# Patient Record
Sex: Female | Born: 1990 | Race: White | Hispanic: No | Marital: Single | Smoking: Current some day smoker
Health system: Southern US, Community
[De-identification: ages and names within clinical notes are randomized; demographics above are authoritative.]

---
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
## 2013-12-03 ENCOUNTER — Inpatient Hospital Stay (HOSPITAL_COMMUNITY): Payer: Federal, State, Local not specified - PPO

## 2013-12-03 ENCOUNTER — Inpatient Hospital Stay (HOSPITAL_COMMUNITY)
Admission: AD | Admit: 2013-12-03 | Discharge: 2013-12-03 | Disposition: A | Discharge status: Home / Self Care | Payer: Federal, State, Local not specified - PPO | Source: Ambulatory Visit | Attending: Obstetrics and Gynecology | Admitting: Obstetrics and Gynecology

## 2013-12-03 ENCOUNTER — Encounter (HOSPITAL_COMMUNITY): Payer: Self-pay | Admitting: *Deleted

## 2013-12-03 DIAGNOSIS — O200 Threatened abortion: Secondary | ICD-10-CM

## 2013-12-03 DIAGNOSIS — N898 Other specified noninflammatory disorders of vagina: Secondary | ICD-10-CM | POA: Diagnosis present

## 2013-12-03 LAB — CBC
HEMATOCRIT: 39.8 % (ref 36.0–46.0)
HEMOGLOBIN: 13.5 g/dL (ref 12.0–15.0)
MCH: 29.4 pg (ref 26.0–34.0)
MCHC: 33.9 g/dL (ref 30.0–36.0)
MCV: 86.7 fL (ref 78.0–100.0)
Platelets: 237 10*3/uL (ref 150–400)
RBC: 4.59 MIL/uL (ref 3.87–5.11)
RDW: 13.1 % (ref 11.5–15.5)
WBC: 8.8 10*3/uL (ref 4.0–10.5)

## 2013-12-03 LAB — URINALYSIS, ROUTINE W REFLEX MICROSCOPIC
Bilirubin Urine: NEGATIVE
GLUCOSE, UA: NEGATIVE mg/dL
KETONES UR: NEGATIVE mg/dL
LEUKOCYTES UA: NEGATIVE
Nitrite: NEGATIVE
PH: 7 (ref 5.0–8.0)
Protein, ur: NEGATIVE mg/dL
Specific Gravity, Urine: 1.01 (ref 1.005–1.030)
Urobilinogen, UA: 0.2 mg/dL (ref 0.0–1.0)

## 2013-12-03 LAB — URINE MICROSCOPIC-ADD ON

## 2013-12-03 LAB — HCG, QUANTITATIVE, PREGNANCY: HCG, BETA CHAIN, QUANT, S: 9 m[IU]/mL — AB (ref <5)

## 2013-12-03 LAB — ABO/RH: ABO/RH(D): A NEG

## 2013-12-03 MED ORDER — RHO D IMMUNE GLOBULIN 1500 UNIT/2ML IJ SOSY
300.0000 ug | PREFILLED_SYRINGE | Freq: Once | INTRAMUSCULAR | Status: AC
  Administered 2013-12-03: 300 ug via INTRAMUSCULAR
  Filled 2013-12-03: qty 2

## 2013-12-03 NOTE — MAU Provider Note (Signed)
Attestation of Attending Supervision of Advanced Practitioner (CNM/NP): Evaluation and management procedures were performed by the Advanced Practitioner under my supervision and collaboration.  I have reviewed the Advanced Practitioner's note and chart, and I agree with the management and plan.  Saida Lonon 12/03/2013 5:44 PM   

## 2013-12-03 NOTE — MAU Provider Note (Signed)
History     CSN: [id number]  Arrival date and time: 12/03/13 1322   None     Chief Complaint  Patient presents with  . Possible Pregnancy  . Vaginal Discharge  . Abdominal Cramping   Possible Pregnancy  Vaginal Discharge The patient's primary symptoms include a vaginal discharge.  Abdominal Cramping    Tara Bailey is a 23 y.o. G1P0000 at Unknown who presents today with vaginal bleeding. She states that yesterday she was having spotting and some brown bleeding. Today she has had heavy bright red bleeding and cramping. She has not established care at this time.   History reviewed. No pertinent past medical history.  History reviewed. No pertinent past surgical history.  History reviewed. No pertinent family history.  History  Substance Use Topics  . Smoking status: Never Smoker   . Smokeless tobacco: Never Used  . Alcohol Use: No    Allergies:  Allergies  Allergen Reactions  . Iodine Nausea And Vomiting  . Latex Itching and Rash    Prescriptions prior to admission  Medication Sig Dispense Refill  . Prenatal Vit-Fe Fumarate-FA (PRENATAL MULTIVITAMIN) TABS tablet Take 1 tablet by mouth daily at 12 noon.        Review of Systems  Genitourinary: Positive for vaginal discharge.   Physical Exam   Blood pressure 129/75, pulse 83, temperature 98.8 F (37.1 C), resp. rate 16, height  (1.727 m), weight 72.666 kg (160 lb 3.2 oz), last menstrual period 10/20/2013, SpO2 99.00%.  Physical Exam  Nursing note and vitals reviewed. Constitutional: She is oriented to person, place, and time. She appears well-developed and well-nourished. No distress.  Cardiovascular: Normal rate.   Respiratory: Effort normal.  GI: Soft. There is no tenderness. There is no rebound.  Genitourinary:   External: no lesion Vagina: Moderate amount or BRB in vagina, with ? Tissue/membrane Cervix: pink, smooth,  Uterus: NSSC Adnexa: NT   Neurological: She is alert and oriented  to person, place, and time.  Skin: Skin is warm and dry.  Psychiatric: She has a normal mood and affect.    MAU Course  Procedures Results for orders placed during the hospital encounter of 12/03/13 (from the past 24 hour(s))  URINALYSIS, ROUTINE W REFLEX MICROSCOPIC     Status: Abnormal   Collection Time    12/03/13  1:50 PM      Result Value Ref Range   Color, Urine YELLOW  YELLOW   APPearance CLEAR  CLEAR   Specific Gravity, Urine 1.010  1.005 - 1.030   pH 7.0  5.0 - 8.0   Glucose, UA NEGATIVE  NEGATIVE mg/dL   Hgb urine dipstick LARGE (*) NEGATIVE   Bilirubin Urine NEGATIVE  NEGATIVE   Ketones, ur NEGATIVE  NEGATIVE mg/dL   Protein, ur NEGATIVE  NEGATIVE mg/dL   Urobilinogen, UA 0.2  0.0 - 1.0 mg/dL   Nitrite NEGATIVE  NEGATIVE   Leukocytes, UA NEGATIVE  NEGATIVE  URINE MICROSCOPIC-ADD ON     Status: Abnormal   Collection Time    12/03/13  1:50 PM      Result Value Ref Range   Squamous Epithelial / LPF FEW (*) RARE   WBC, UA 0-2  <3 WBC/hpf   RBC / HPF 0-2  <3 RBC/hpf   Bacteria, UA RARE  RARE   Urine-Other AMORPHOUS URATES/PHOSPHATES    ABO/RH     Status: None   Collection Time    12/03/13  2:51 PM      Result Value  Ref Range   ABO/RH(D) A NEG    HCG, QUANTITATIVE, PREGNANCY     Status: Abnormal   Collection Time    12/03/13  2:51 PM      Result Value Ref Range   hCG, Beta Chain, Quant, S 9 (*) <5 mIU/mL  RH IG WORKUP (INCLUDES ABO/RH)     Status: None   Collection Time    12/03/13  2:51 PM      Result Value Ref Range   Gestational Age(Wks) 4     ABO/RH(D) A NEG     Antibody Screen PENDING    CBC     Status: None   Collection Time    12/03/13  2:52 PM      Result Value Ref Range   WBC 8.8  4.0 - 10.5 K/uL   RBC 4.59  3.87 - 5.11 MIL/uL   Hemoglobin 13.5  12.0 - 15.0 g/dL   HCT 16.1  09.6 - 04.5 %   MCV 86.7  78.0 - 100.0 fL   MCH 29.4  26.0 - 34.0 pg   MCHC 33.9  30.0 - 36.0 g/dL   RDW 40.9  81.1 - 91.4 %   Platelets 237  150 - 400 K/uL   US Ob  Comp Less 14 Wks  12/03/2013   CLINICAL DATA:  Bleeding, cramping  EXAM: OBSTETRIC <14 WK [place] AND TRANSVAGINAL OB US  TECHNIQUE: Both transabdominal and transvaginal ultrasound examinations were performed for complete evaluation of the gestation as well as the maternal uterus, adnexal regions, and pelvic cul-de-sac. Transvaginal technique was performed to assess early pregnancy.  COMPARISON:  None.  FINDINGS: Intrauterine gestational sac: Not visualized  Yolk sac:  Not visualized  Embryo:  Not visualized  Maternal uterus/adnexae: Endometrium is 5 mm in thickness. Ovaries are symmetric in size and echotexture. No adnexal masses or free fluid.  IMPRESSION: No intrauterine pregnancy visualized. Differential considerations would include early intrauterine pregnancy too early to visualize, spontaneous abortion, or occult ectopic pregnancy. Recommend close clinical followup and serial quantitative beta HCGs. Repeat ultrasound in 14 days may be helpful.   Electronically Signed   By: Charlett Nose M.D.   On: 12/03/2013 16:03   US Ob Transvaginal  12/03/2013   CLINICAL DATA:  Bleeding, cramping  EXAM: OBSTETRIC <14 WK [place] AND TRANSVAGINAL OB US  TECHNIQUE: Both transabdominal and transvaginal ultrasound examinations were performed for complete evaluation of the gestation as well as the maternal uterus, adnexal regions, and pelvic cul-de-sac. Transvaginal technique was performed to assess early pregnancy.  COMPARISON:  None.  FINDINGS: Intrauterine gestational sac: Not visualized  Yolk sac:  Not visualized  Embryo:  Not visualized  Maternal uterus/adnexae: Endometrium is 5 mm in thickness. Ovaries are symmetric in size and echotexture. No adnexal masses or free fluid.  IMPRESSION: No intrauterine pregnancy visualized. Differential considerations would include early intrauterine pregnancy too early to visualize, spontaneous abortion, or occult ectopic pregnancy. Recommend close clinical followup and serial quantitative beta  HCGs. Repeat ultrasound in 14 days may be helpful.   Electronically Signed   By: Charlett Nose M.D.   On: 12/03/2013 16:03     Assessment and Plan   1. Threatened abortion in early pregnancy    Very likely SAB given extremely low HCG Rhogam given today Bleeding precautions Return to MAU as needed  Follow-up Information   Follow up with [facility]. (12/10/13 between 8am-11am )    Specialty:  Obstetrics and Gynecology   Contact information:   [phone]  [place] [address] [phone]       Tawnya Crook 12/03/2013, 3:35 PM

## 2013-12-03 NOTE — MAU Note (Signed)
Patient states she has had positive pregnancy tests at home and at [facility] today. States she has been having a dark discharge for several days, was heavy and red last night, clotty and dark today. Having a lot of abdominal cramping.

## 2013-12-03 NOTE — Discharge Instructions (Signed)

## 2013-12-04 LAB — RH IG WORKUP (INCLUDES ABO/RH)
ABO/RH(D): A NEG
Antibody Screen: NEGATIVE
Gestational Age(Wks): 4
UNIT DIVISION: 0

## 2013-12-10 ENCOUNTER — Other Ambulatory Visit: Payer: Federal, State, Local not specified - PPO

## 2013-12-13 ENCOUNTER — Encounter: Payer: Self-pay | Admitting: *Deleted

## 2014-01-14 ENCOUNTER — Encounter: Payer: Self-pay | Admitting: General Practice

## 2014-01-17 ENCOUNTER — Encounter (HOSPITAL_COMMUNITY): Payer: Self-pay | Admitting: *Deleted

## 2017-02-02 ENCOUNTER — Other Ambulatory Visit: Payer: Self-pay

## 2017-02-02 ENCOUNTER — Emergency Department (HOSPITAL_COMMUNITY)
Admission: EM | Admit: 2017-02-02 | Discharge: 2017-02-03 | Disposition: A | Discharge status: Home / Self Care | Payer: BLUE CROSS/BLUE SHIELD | Attending: Emergency Medicine | Admitting: Emergency Medicine

## 2017-02-02 ENCOUNTER — Encounter (HOSPITAL_COMMUNITY): Payer: Self-pay | Admitting: Emergency Medicine

## 2017-02-02 ENCOUNTER — Emergency Department (HOSPITAL_COMMUNITY): Payer: BLUE CROSS/BLUE SHIELD

## 2017-02-02 DIAGNOSIS — Y939 Activity, unspecified: Secondary | ICD-10-CM | POA: Insufficient documentation

## 2017-02-02 DIAGNOSIS — W19XXXA Unspecified fall, initial encounter: Secondary | ICD-10-CM

## 2017-02-02 DIAGNOSIS — R51 Headache: Secondary | ICD-10-CM | POA: Diagnosis not present

## 2017-02-02 DIAGNOSIS — S0181XA Laceration without foreign body of other part of head, initial encounter: Secondary | ICD-10-CM | POA: Diagnosis present

## 2017-02-02 DIAGNOSIS — M542 Cervicalgia: Secondary | ICD-10-CM | POA: Insufficient documentation

## 2017-02-02 DIAGNOSIS — F17210 Nicotine dependence, cigarettes, uncomplicated: Secondary | ICD-10-CM | POA: Insufficient documentation

## 2017-02-02 DIAGNOSIS — S0990XA Unspecified injury of head, initial encounter: Secondary | ICD-10-CM | POA: Diagnosis not present

## 2017-02-02 DIAGNOSIS — Y929 Unspecified place or not applicable: Secondary | ICD-10-CM | POA: Diagnosis not present

## 2017-02-02 DIAGNOSIS — R040 Epistaxis: Secondary | ICD-10-CM | POA: Diagnosis not present

## 2017-02-02 DIAGNOSIS — W01190A Fall on same level from slipping, tripping and stumbling with subsequent striking against furniture, initial encounter: Secondary | ICD-10-CM | POA: Insufficient documentation

## 2017-02-02 DIAGNOSIS — S022XXA Fracture of nasal bones, initial encounter for closed fracture: Secondary | ICD-10-CM

## 2017-02-02 DIAGNOSIS — Y999 Unspecified external cause status: Secondary | ICD-10-CM | POA: Insufficient documentation

## 2017-02-02 MED ORDER — TETANUS-DIPHTH-ACELL PERTUSSIS 5-2.5-18.5 LF-MCG/0.5 IM SUSP: 0.5000 mL | Freq: Once | INTRAMUSCULAR | Status: DC

## 2017-02-02 NOTE — ED Notes (Signed)
dermabond is at bedside

## 2017-02-02 NOTE — ED Triage Notes (Signed)
Pt states she tripped and fell hitting her face on the glass coffee table   Pt has been drinking a lot today  Pt has a laceration to the bridge of her nose and she is bleeding from her nares  Pt states she did has a positive LOC   Pt is c/o neck pain  C collar placed in triage  Ice pack applied to nose

## 2017-02-03 NOTE — ED Provider Notes (Signed)
[facility]-EMERGENCY DEPT Provider Note   CSN: [id number] Arrival date & time: 02/02/17  2040     History   Chief Complaint Chief Complaint  Patient presents with  . Fall  . Facial Injury    HPI Tara Bailey is a 26 y.o. female.  The history is provided by the patient. No language interpreter was used.  Fall  Associated symptoms include headaches.  Facial Injury  Associated symptoms: headaches    Tara Bailey is an otherwise healthy 26 y.o. female who presents to the Emergency Department region of her fall just prior to arrival.  Patient states that she was drinking alcohol and tripped, causing her to fall face forward onto a glass table.  She does note loss of consciousness, but unsure of timeframe.  Initially bilateral nares were bleeding, however this resolved with pressure by EMS.  She is complaining of headache, neck pain and small laceration to the bridge of the nose.  No medications taken prior to arrival for symptoms.  Pain is worse with certain movements and palpation.  Denies abdominal pain, nausea or vomiting.  No chest pain or shortness of breath.  Unsure of tetanus status.   History reviewed. No pertinent past medical history.  There are no active problems to display for this patient.   History reviewed. No pertinent surgical history.  OB History    Gravida Para Term Preterm AB Living   1 0 0 0 0 0   SAB TAB Ectopic Multiple Live Births   0 0 0 0         Home Medications    Prior to Admission medications   Medication Sig Start Date End Date Taking? Authorizing Provider  Prenatal Vit-Fe Fumarate-FA (PRENATAL MULTIVITAMIN) TABS tablet Take 1 tablet by mouth daily at 12 noon.    [provider]    Family History Family History  Problem Relation Age of Onset  . Cancer Other   . Diabetes Other     Social History Social History   Tobacco Use  . Smoking status: Current Some Day Smoker  . Smokeless  tobacco: Never Used  Substance Use Topics  . Alcohol use: Yes    Comment: heavy   . Drug use: No     Allergies   Iodine and Latex   Review of Systems Review of Systems  Musculoskeletal: Positive for arthralgias.  Skin: Positive for wound.  Neurological: Positive for syncope and headaches. Negative for dizziness, weakness and numbness.  All other systems reviewed and are negative.    Physical Exam Updated Vital Signs BP 132/88   Pulse (!) 111   Temp 98.2 F (36.8 C) (Oral)   Resp 20   SpO2 99%   Physical Exam  Constitutional: She is oriented to person, place, and time. She appears well-developed and well-nourished. No distress.  HENT:  Head: Normocephalic. Head is without raccoon's eyes and without Battle's sign.    Right Ear: No hemotympanum.  Left Ear: No hemotympanum.  0.5 cm superficial laceration to the bridge of the nose. No septal hematoma.   Eyes: EOM are normal. Pupils are equal, round, and reactive to light.  Neck: Neck supple.  C-collar in place. + midline tenderness.  Cardiovascular: Regular rhythm and normal heart sounds.  No murmur heard. RRR on exam.  Pulmonary/Chest: Effort normal and breath sounds normal. No respiratory distress.  Abdominal: Soft. She exhibits no distension. There is no tenderness.  Musculoskeletal: Normal range of motion.  Neurological: She is alert and  oriented to person, place, and time.  Speech clear and goal oriented. CN 2-12 grossly intact. Normal finger-to-nose and rapid alternating movements. No drift. Strength and sensation intact.  Skin: Skin is warm and dry.  Nursing note and vitals reviewed.    ED Treatments / Results  Labs (all labs ordered are listed, but only abnormal results are displayed) Labs Reviewed - No data to display  EKG  EKG Interpretation None       Radiology Ct Head Wo Contrast  Result Date: 02/02/2017 CLINICAL DATA:  26 y/o F; fall with facial injury on class coffee table. Laceration to  bridge of nose. EXAM: CT HEAD WITHOUT CONTRAST CT MAXILLOFACIAL WITHOUT CONTRAST CT CERVICAL SPINE WITHOUT CONTRAST TECHNIQUE: Multidetector CT imaging of the head, cervical spine, and maxillofacial structures were performed using the standard protocol without intravenous contrast. Multiplanar CT image reconstructions of the cervical spine and maxillofacial structures were also generated. COMPARISON:  None. FINDINGS: CT HEAD FINDINGS Brain: No evidence of acute infarction, hemorrhage, hydrocephalus, extra-axial collection or mass lesion/mass effect. Vascular: No hyperdense vessel or unexpected calcification. Skull: Small contusion of the right frontal scalp. No calvarial fracture. Other: None. CT MAXILLOFACIAL FINDINGS Osseous: Acute minimally displaced and leftward angulated fractures of the nasal bones. No fracture propagation into sinuses or orbits. Minimally displaced fracture of left aspect of anterior maxillary spine (series 3, image 41). Orbits: Negative. No traumatic or inflammatory finding. Sinuses: Clear. Soft tissues: Soft tissue swelling of the nasal bridge and superior lip. Several foci of air present within the soft tissues near the base of the right in nose which may be due to laceration either superficially or from the oropharynx. CT CERVICAL SPINE FINDINGS Alignment: Normal. Skull base and vertebrae: No acute fracture. No primary bone lesion or focal pathologic process. Soft tissues and spinal canal: No prevertebral fluid or swelling. No visible canal hematoma. Disc levels:  No significant cervical degenerative changes. Upper chest: Negative. Other: Negative. IMPRESSION: CT head: No acute intracranial abnormality or calvarial fracture. Small contusion of right frontal scalp. Otherwise unremarkable CT of the head. CT maxillofacial: 1. Acute minimally displaced and mild leftward angulated fractures of the nasal bones. 2. Minimally displaced acute fracture of left aspect of anterior maxillary spine. 3.  Soft tissue swelling of nasal bridge and upper lip. 4. Foci of air within soft tissues near right base of nose may be due to superficial or oropharyngeal laceration. CT cervical spine: No acute fracture or dislocation.  Unremarkable CT cervical spine. Electronically Signed   By: Mitzi HansenLance  Furusawa-Stratton M.D.   On: 02/02/2017 23:32   Ct Cervical Spine Wo Contrast  Result Date: 02/02/2017 CLINICAL DATA:  26 y/o F; fall with facial injury on class coffee table. Laceration to bridge of nose. EXAM: CT HEAD WITHOUT CONTRAST CT MAXILLOFACIAL WITHOUT CONTRAST CT CERVICAL SPINE WITHOUT CONTRAST TECHNIQUE: Multidetector CT imaging of the head, cervical spine, and maxillofacial structures were performed using the standard protocol without intravenous contrast. Multiplanar CT image reconstructions of the cervical spine and maxillofacial structures were also generated. COMPARISON:  None. FINDINGS: CT HEAD FINDINGS Brain: No evidence of acute infarction, hemorrhage, hydrocephalus, extra-axial collection or mass lesion/mass effect. Vascular: No hyperdense vessel or unexpected calcification. Skull: Small contusion of the right frontal scalp. No calvarial fracture. Other: None. CT MAXILLOFACIAL FINDINGS Osseous: Acute minimally displaced and leftward angulated fractures of the nasal bones. No fracture propagation into sinuses or orbits. Minimally displaced fracture of left aspect of anterior maxillary spine (series 3, image 41). Orbits: Negative. No traumatic  or inflammatory finding. Sinuses: Clear. Soft tissues: Soft tissue swelling of the nasal bridge and superior lip. Several foci of air present within the soft tissues near the base of the right in nose which may be due to laceration either superficially or from the oropharynx. CT CERVICAL SPINE FINDINGS Alignment: Normal. Skull base and vertebrae: No acute fracture. No primary bone lesion or focal pathologic process. Soft tissues and spinal canal: No prevertebral fluid or  swelling. No visible canal hematoma. Disc levels:  No significant cervical degenerative changes. Upper chest: Negative. Other: Negative. IMPRESSION: CT head: No acute intracranial abnormality or calvarial fracture. Small contusion of right frontal scalp. Otherwise unremarkable CT of the head. CT maxillofacial: 1. Acute minimally displaced and mild leftward angulated fractures of the nasal bones. 2. Minimally displaced acute fracture of left aspect of anterior maxillary spine. 3. Soft tissue swelling of nasal bridge and upper lip. 4. Foci of air within soft tissues near right base of nose may be due to superficial or oropharyngeal laceration. CT cervical spine: No acute fracture or dislocation.  Unremarkable CT cervical spine. Electronically Signed   By: Mitzi HansenLance  Furusawa-Stratton M.D.   On: 02/02/2017 23:32   Ct Maxillofacial Wo Contrast  Result Date: 02/02/2017 CLINICAL DATA:  26 y/o F; fall with facial injury on class coffee table. Laceration to bridge of nose. EXAM: CT HEAD WITHOUT CONTRAST CT MAXILLOFACIAL WITHOUT CONTRAST CT CERVICAL SPINE WITHOUT CONTRAST TECHNIQUE: Multidetector CT imaging of the head, cervical spine, and maxillofacial structures were performed using the standard protocol without intravenous contrast. Multiplanar CT image reconstructions of the cervical spine and maxillofacial structures were also generated. COMPARISON:  None. FINDINGS: CT HEAD FINDINGS Brain: No evidence of acute infarction, hemorrhage, hydrocephalus, extra-axial collection or mass lesion/mass effect. Vascular: No hyperdense vessel or unexpected calcification. Skull: Small contusion of the right frontal scalp. No calvarial fracture. Other: None. CT MAXILLOFACIAL FINDINGS Osseous: Acute minimally displaced and leftward angulated fractures of the nasal bones. No fracture propagation into sinuses or orbits. Minimally displaced fracture of left aspect of anterior maxillary spine (series 3, image 41). Orbits: Negative. No  traumatic or inflammatory finding. Sinuses: Clear. Soft tissues: Soft tissue swelling of the nasal bridge and superior lip. Several foci of air present within the soft tissues near the base of the right in nose which may be due to laceration either superficially or from the oropharynx. CT CERVICAL SPINE FINDINGS Alignment: Normal. Skull base and vertebrae: No acute fracture. No primary bone lesion or focal pathologic process. Soft tissues and spinal canal: No prevertebral fluid or swelling. No visible canal hematoma. Disc levels:  No significant cervical degenerative changes. Upper chest: Negative. Other: Negative. IMPRESSION: CT head: No acute intracranial abnormality or calvarial fracture. Small contusion of right frontal scalp. Otherwise unremarkable CT of the head. CT maxillofacial: 1. Acute minimally displaced and mild leftward angulated fractures of the nasal bones. 2. Minimally displaced acute fracture of left aspect of anterior maxillary spine. 3. Soft tissue swelling of nasal bridge and upper lip. 4. Foci of air within soft tissues near right base of nose may be due to superficial or oropharyngeal laceration. CT cervical spine: No acute fracture or dislocation.  Unremarkable CT cervical spine. Electronically Signed   By: Mitzi HansenLance  Furusawa-Stratton M.D.   On: 02/02/2017 23:32    Procedures Procedures (including critical care time)  Medications Ordered in ED Medications  Tdap (BOOSTRIX) injection 0.5 mL (not administered)     Initial Impression / Assessment and Plan / ED Course  I have  reviewed the triage vital signs and the nursing notes.  Pertinent labs & imaging results that were available during my care of the patient were reviewed by me and considered in my medical decision making (see chart for details).    Tara Bailey is a 26 y.o. female who presents to ED for evaluation after mechanical fall.  No focal neuro deficits on exam.  CT head and cervical spine with no acute  abnormalities.  CT maxillofacial shows an acute minimally displaced and mild leftward angulated fractures of the nasal bones and minimally displaced acute fracture of the left aspect anterior maxillary spine. Very superficial, small laceration over the bridge of the nose not requiring suturing, however wound was thoroughly cleaned and steri-strips placed. Tdap updated. ENT follow up. Symptomatic home care instructions, wound care and head injury precautions discussed. Reasons to return to ER discussed and all questions answered.  Patient discussed with Dr. Abundio Miu who agrees with treatment plan.   Final Clinical Impressions(s) / ED Diagnoses   Final diagnoses:  Fall, initial encounter  Injury of head, initial encounter  Closed fracture of nasal bone, initial encounter    ED Discharge Orders    None         Ward, Chase Picket, PA-C 02/03/17 0010    Tegeler, Canary Brim, MD 02/03/17 1050

## 2017-02-03 NOTE — Discharge Instructions (Signed)
It was my pleasure taking care of you today!   Ice to the nose for swelling relief.   Alternate between Tylenol and ibuprofen as needed for pain.   Keep wound clean and dry. Return to ER or see PCP for redness or worsening wound.   Please call the ENT (ear, nose, throat) doctor listed on Monday morning to schedule a follow up appointment.   Return to ER for new or worsening symptoms, any additional concerns.

## 2019-11-02 ENCOUNTER — Ambulatory Visit (INDEPENDENT_AMBULATORY_CARE_PROVIDER_SITE_OTHER): Payer: 59 | Admitting: Physician Assistant

## 2019-11-02 ENCOUNTER — Other Ambulatory Visit: Payer: Self-pay

## 2019-11-02 ENCOUNTER — Encounter: Payer: Self-pay | Admitting: Physician Assistant

## 2019-11-02 DIAGNOSIS — L814 Other melanin hyperpigmentation: Secondary | ICD-10-CM

## 2019-11-02 DIAGNOSIS — Z1283 Encounter for screening for malignant neoplasm of skin: Secondary | ICD-10-CM | POA: Diagnosis not present

## 2019-11-02 DIAGNOSIS — L820 Inflamed seborrheic keratosis: Secondary | ICD-10-CM | POA: Diagnosis not present

## 2019-11-02 MED ORDER — TRETINOIN 0.025 % EX CREA: TOPICAL_CREAM | Freq: Every evening | CUTANEOUS | 6 refills | Status: AC

## 2019-11-04 NOTE — Progress Notes (Signed)
   New Patient   Subjective  Tara Bailey is a 29 y.o. female who presents for the following: New Patient (Initial Visit) (skin check---bumps).   The following portions of the chart were reviewed this encounter and updated as appropriate: Tobacco  Allergies  Meds  Problems  Med Hx  Surg Hx  Fam Hx      Objective  Well appearing patient in no apparent distress; mood and affect are within normal limits.  A full examination was performed including scalp, head, eyes, ears, nose, lips, neck, chest, axillae, abdomen, back, buttocks, bilateral upper extremities, bilateral lower extremities, hands, feet, fingers, toes, fingernails, and toenails. All findings within normal limits unless otherwise noted below.  Objective  Head - to toe: No atypical nevi No signs of non-mole skin cancer.   Objective  Right upper eyelid: Small white crusts   Assessment & Plan  Screening exam for skin cancer Head - to toe  observe  Inflamed seborrheic keratosis Right upper eyelid  Destruction of lesion - Right upper eyelid  Destruction method: cryotherapy   Informed consent: discussed and consent obtained   Timeout:  patient name, date of birth, surgical site, and procedure verified Lesion destroyed using liquid nitrogen: Yes   Cryotherapy cycles:  1 Outcome: patient tolerated procedure well with no complications   Post-procedure details: wound care instructions given      Lentigines - Scattered tan macules - Discussed due to sun exposure - Benign, observe - Call for any changes  I, Ellyanna Holton, PA-C, have reviewed all documentation for this visit. The documentation on 11/04/19 for the exam, diagnosis, procedures, and orders are all accurate and complete.

## 2021-05-06 ENCOUNTER — Encounter (HOSPITAL_COMMUNITY): Payer: Self-pay | Admitting: Emergency Medicine

## 2021-05-06 ENCOUNTER — Emergency Department (HOSPITAL_COMMUNITY)
Admission: EM | Admit: 2021-05-06 | Discharge: 2021-05-06 | Disposition: A | Discharge status: Home / Self Care | Payer: 59 | Attending: Emergency Medicine | Admitting: Emergency Medicine

## 2021-05-06 ENCOUNTER — Other Ambulatory Visit: Payer: Self-pay

## 2021-05-06 ENCOUNTER — Emergency Department (HOSPITAL_COMMUNITY): Payer: 59

## 2021-05-06 DIAGNOSIS — X509XXA Other and unspecified overexertion or strenuous movements or postures, initial encounter: Secondary | ICD-10-CM | POA: Diagnosis not present

## 2021-05-06 DIAGNOSIS — S93401A Sprain of unspecified ligament of right ankle, initial encounter: Secondary | ICD-10-CM

## 2021-05-06 DIAGNOSIS — R52 Pain, unspecified: Secondary | ICD-10-CM

## 2021-05-06 DIAGNOSIS — S99911A Unspecified injury of right ankle, initial encounter: Secondary | ICD-10-CM | POA: Diagnosis present

## 2021-05-06 NOTE — ED Triage Notes (Signed)
Patient presents complaining of right ankle and foot pain after feeling a pop while going down the stairs. Patient is ambulatory. States there was increased swelling, attempted RICE techniques with improvement.

## 2021-05-06 NOTE — ED Provider Notes (Signed)
[facility] DEPT Provider Note   CSN: [id number] Arrival date & time: 05/06/21  0112     History  Chief Complaint  Patient presents with   Ankle Pain   Foot Pain    Tara Bailey is a 31 y.o. female.  31 year old female who presents to the emergency department today after walking on steps and hearing a pop with pain in her right lateral ankle and immediate swelling.  She states that she rolled her ankle inward when this happened.  States she has had injuries to the ankle before and is concerned and so she came to get checked out.  She tried going to urgent care first.  She states that she did use some ice and elevation prior to coming which helped the swelling but when she walks she still feels a popping sensation and some pain in that same area.  No other injuries.   Ankle Pain Foot Pain      Home Medications Prior to Admission medications   Medication Sig Start Date End Date Taking? Authorizing Provider  Multiple Vitamin (MULTIVITAMIN) tablet Take 1 tablet by mouth daily.    [provider]      Allergies    Iodine and Latex    Review of Systems   Review of Systems  Physical Exam Updated Vital Signs BP (!) 144/86 (BP Location: Left Arm)    Pulse (!) 108    Temp 98.4 F (36.9 C)    Resp 16    Ht 5\' 8"  (1.727 m)    Wt 88.5 kg    LMP 04/04/2021 (Approximate)    SpO2 98%    BMI 29.65 kg/m  Physical Exam Vitals and nursing note reviewed.  Constitutional:      Appearance: She is well-developed.  HENT:     Head: Normocephalic and atraumatic.     Nose: Nose normal. No congestion or rhinorrhea.     Mouth/Throat:     Mouth: Mucous membranes are moist.     Pharynx: Oropharynx is clear.  Eyes:     Pupils: Pupils are equal, round, and reactive to light.  Cardiovascular:     Rate and Rhythm: Normal rate and regular rhythm.  Pulmonary:     Effort: No respiratory distress.     Breath sounds: No stridor.  Abdominal:      General: Abdomen is flat. There is no distension.  Musculoskeletal:        General: Swelling (Mild swelling to right lateral ankle in the small area of ecchymosis) and tenderness present. No deformity or signs of injury. Normal range of motion.     Cervical back: Normal range of motion.  Skin:    General: Skin is warm and dry.  Neurological:     General: No focal deficit present.     Mental Status: She is alert.    ED Results / Procedures / Treatments   Labs (all labs ordered are listed, but only abnormal results are displayed) Labs Reviewed - No data to display  EKG None  Radiology DG Ankle Complete Right  Result Date: 05/06/2021 CLINICAL DATA:  Right foot and ankle pain after feeling a pop going down stairs. Swelling. EXAM: RIGHT ANKLE - COMPLETE 3+ VIEW COMPARISON:  None. FINDINGS: There is no evidence of fracture, dislocation, or joint effusion. The ankle mortise is preserved. Intact talar dome. There is no evidence of arthropathy or other focal bone abnormality. Soft tissues are unremarkable. IMPRESSION: Negative radiographs of the right ankle. Electronically Signed  By: Keith Rake M.D.   On: 05/06/2021 01:37   DG Foot Complete Right  Result Date: 05/06/2021 CLINICAL DATA:  Right foot and ankle pain after feeling a pop while going down stairs. Swelling. EXAM: RIGHT FOOT COMPLETE - 3+ VIEW COMPARISON:  None. FINDINGS: There is no evidence of fracture or dislocation. Normal alignment. Normal joint spaces. There is no evidence of arthropathy or other focal bone abnormality. Soft tissues are unremarkable. IMPRESSION: Negative radiographs of the right foot. Electronically Signed   By: Keith Rake M.D.   On: 05/06/2021 01:38    Procedures Procedures    Medications Ordered in ED Medications - No data to display  ED Course/ Medical Decision Making/ A&P                           Medical Decision Making Amount and/or Complexity of Data Reviewed Radiology:  ordered.   X-rays negative.  Will Ace wrap and give her crutches to use as needed.  If not improving in a week will need to follow-up with her orthopedist for reevaluation and consideration of possible MRI for likely ankle sprain.  Is stable at this time and she is able to ambulate on it without significant gait alteration so low suspicion for unstable injury.   Final Clinical Impression(s) / ED Diagnoses Final diagnoses:  Pain  Sprain of right ankle, unspecified ligament, initial encounter    Rx / DC Orders ED Discharge Orders     None         Jannelle Notaro, Corene Cornea, MD 05/06/21 [phone]

## 2022-07-29 IMAGING — DX DG ANKLE COMPLETE 3+V*R*
3 series · 3 of 3 positions shown · non-contrast
Comparison: None.

CLINICAL DATA: Right foot and ankle pain after feeling a pop going
down stairs. Swelling.

EXAM:
RIGHT ANKLE - COMPLETE 3+ VIEW

[ankle ap]
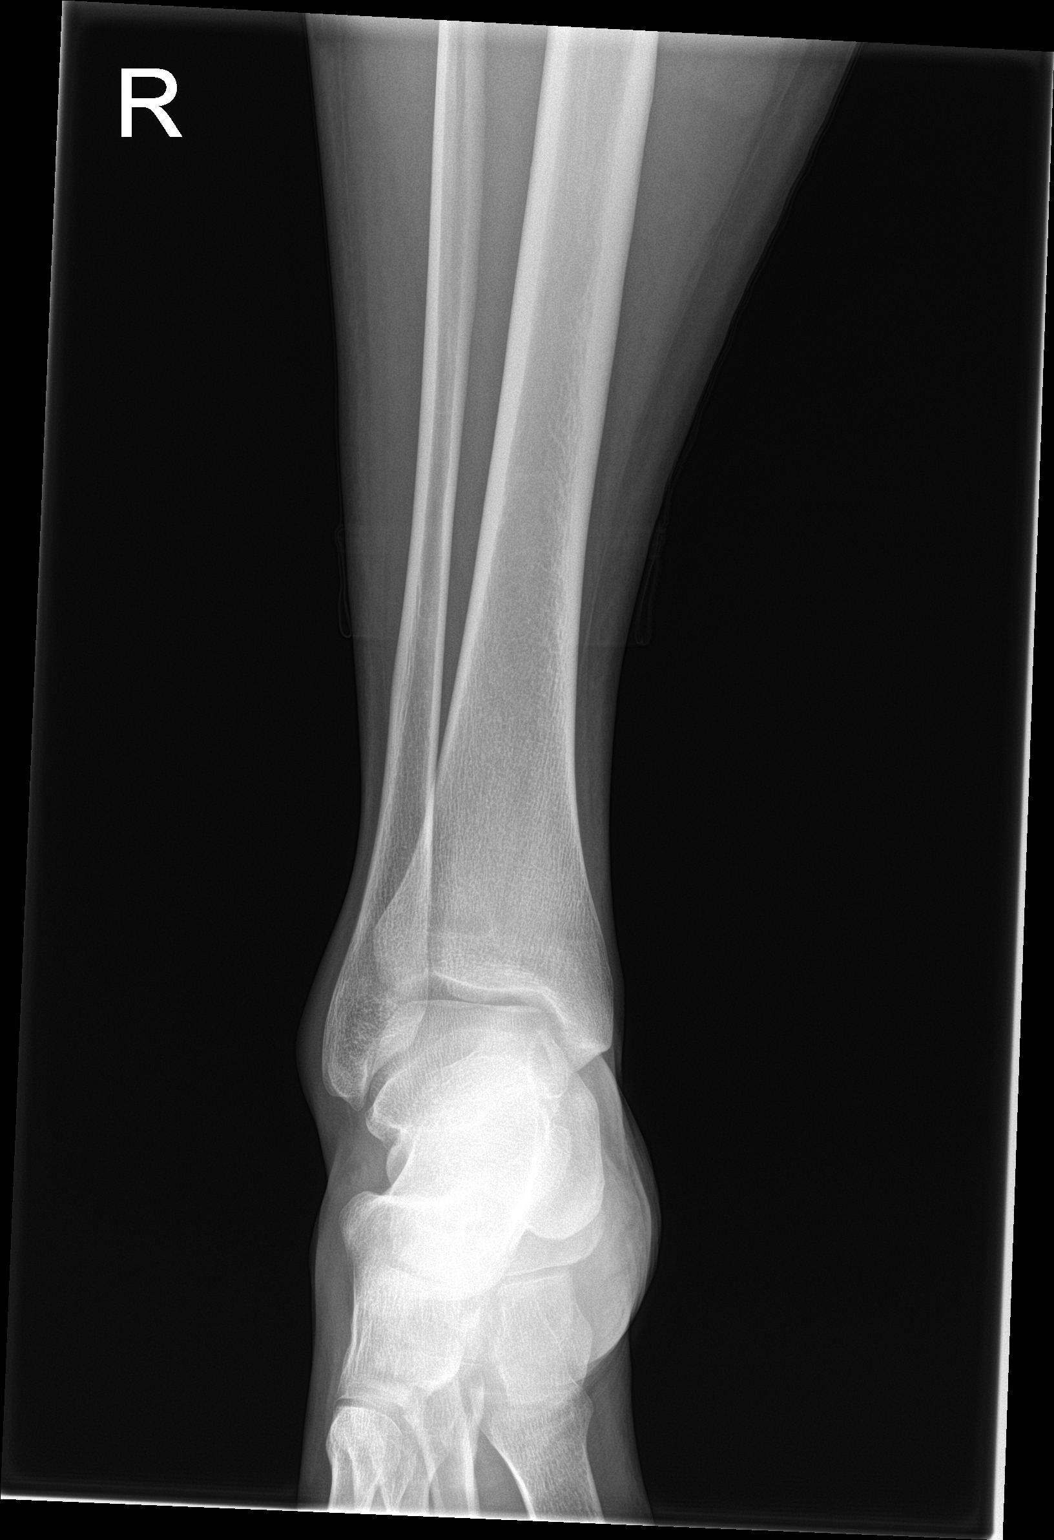

[ankle obl]
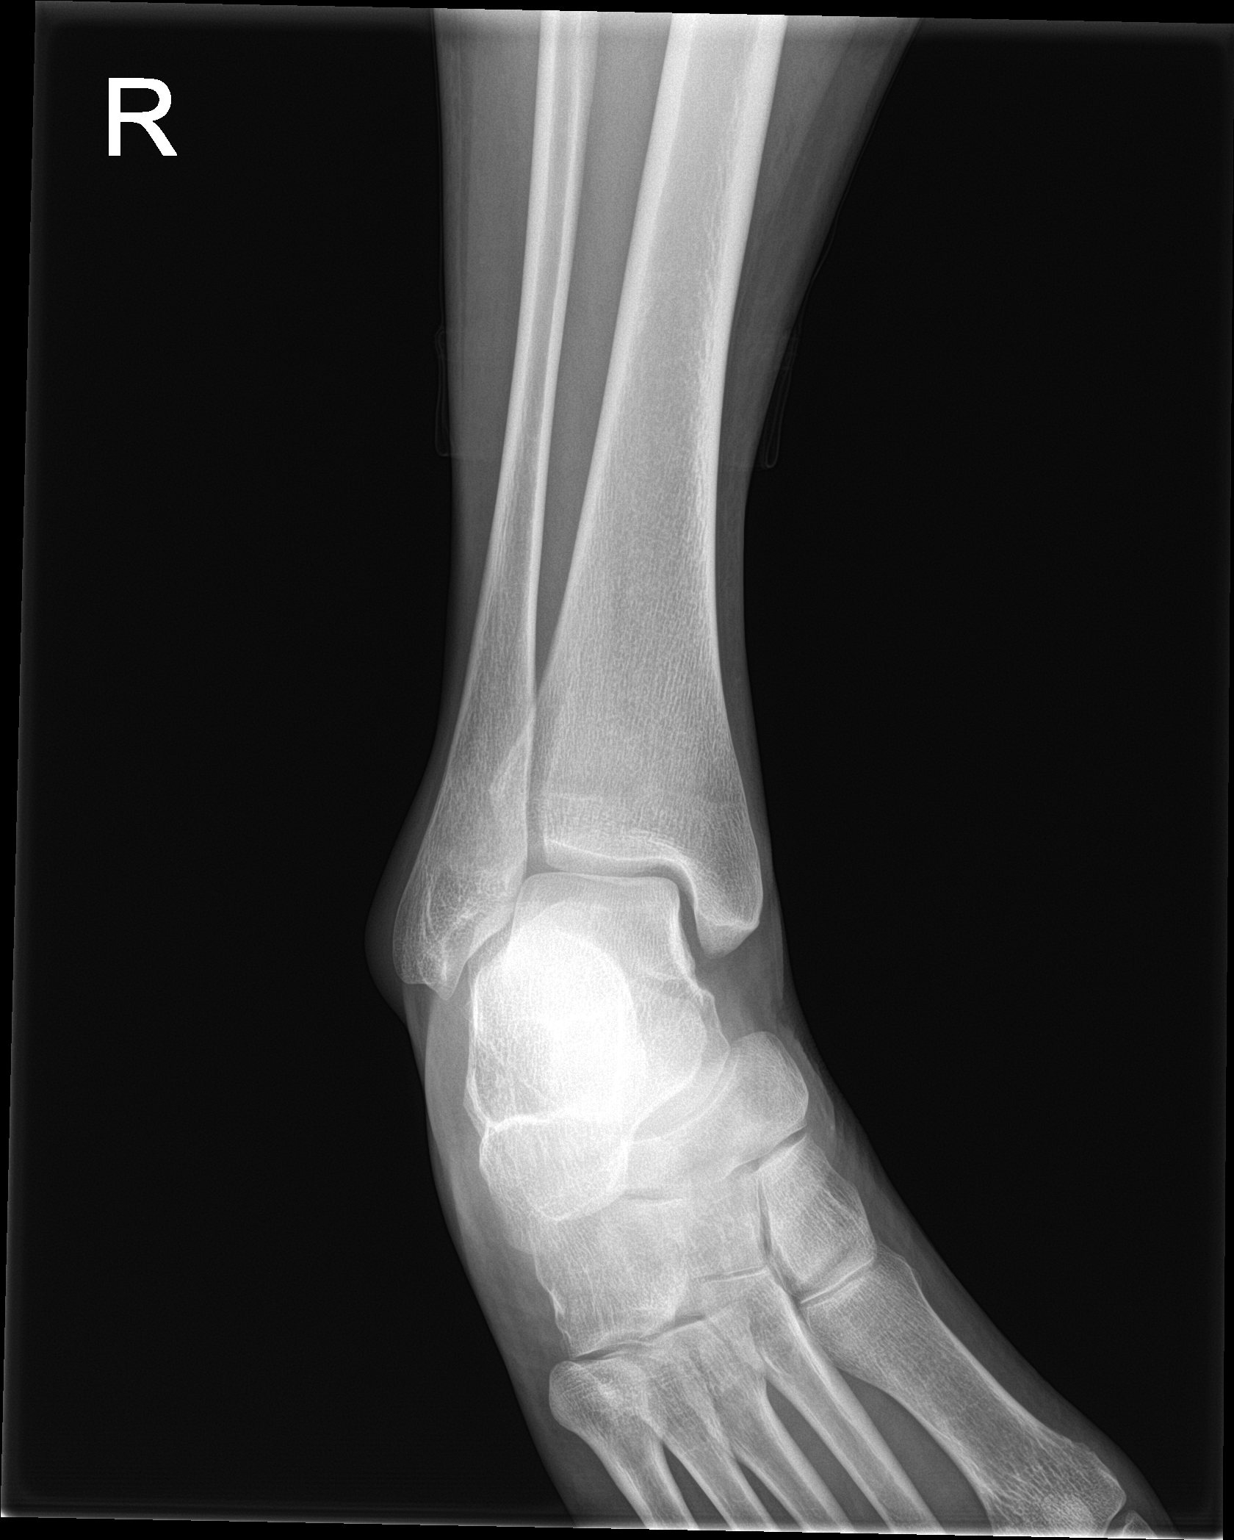

[ankle lat]
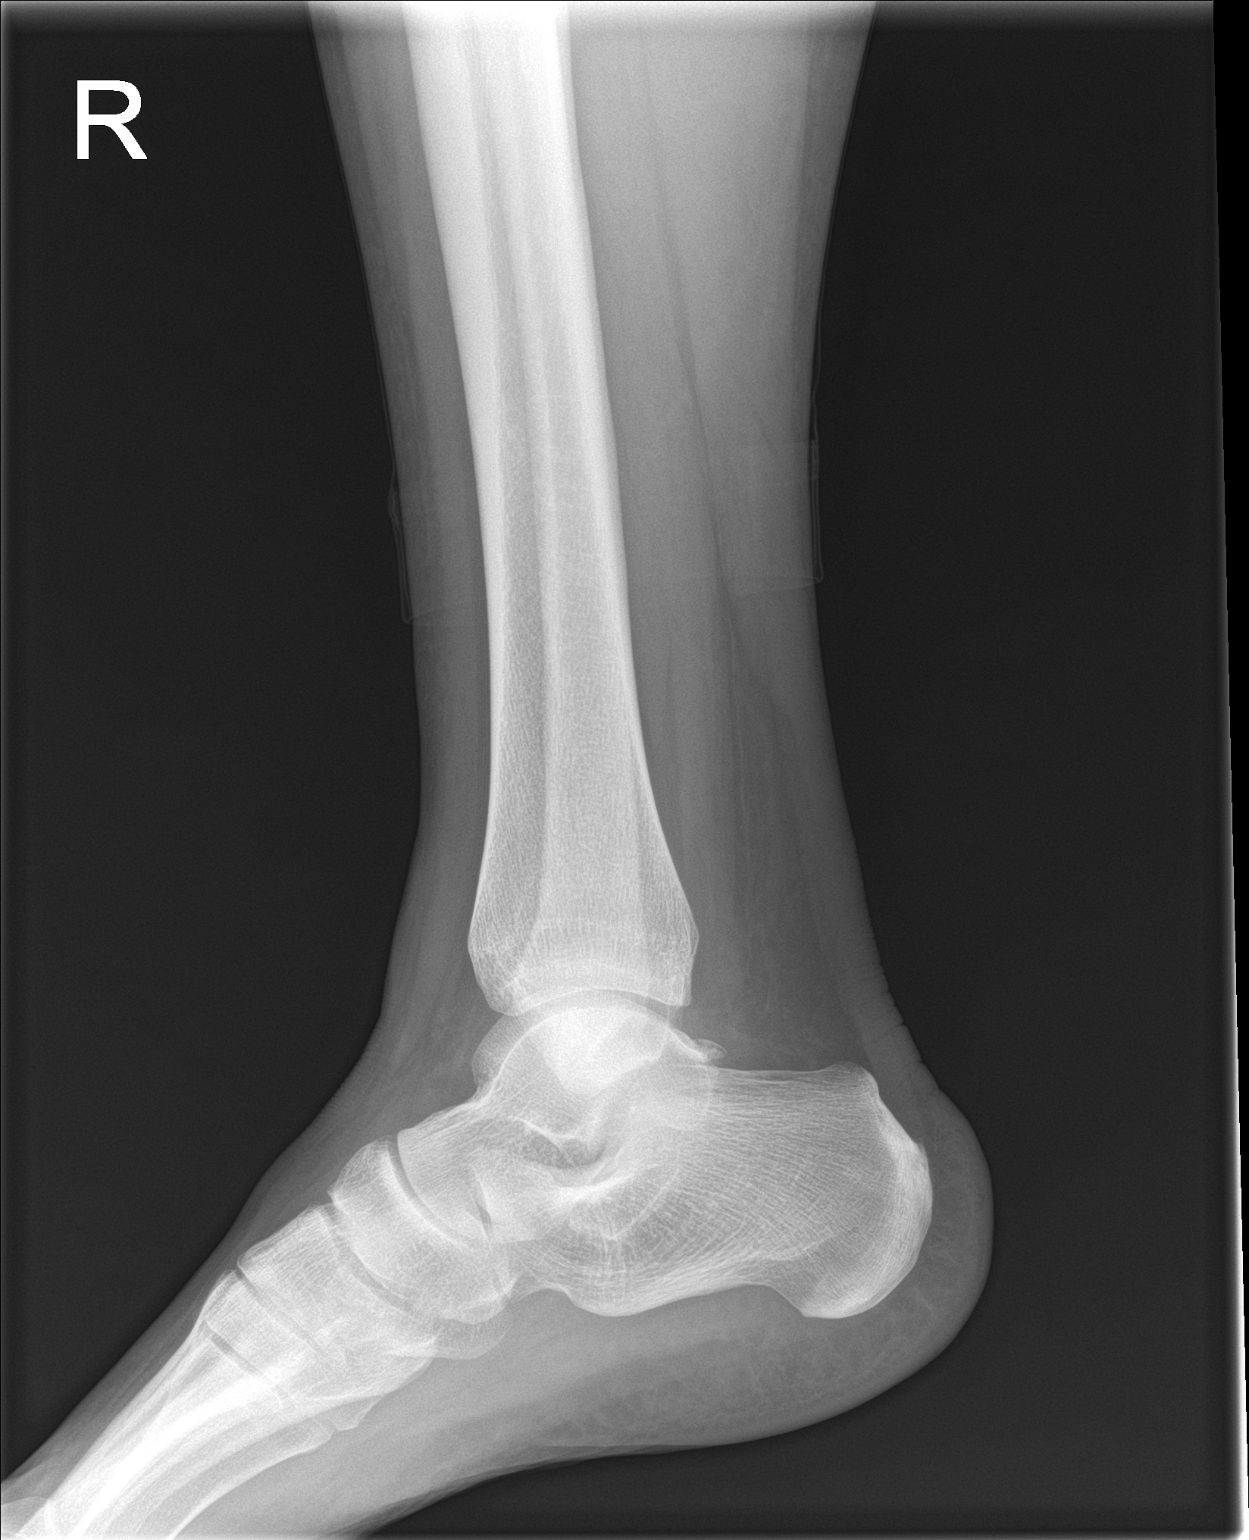

[3 of 3 positions shown; findings below may reference images not displayed]

FINDINGS: There is no evidence of fracture, dislocation, or joint effusion.
The ankle mortise is preserved. Intact talar dome. There is no
evidence of arthropathy or other focal bone abnormality. Soft
tissues are unremarkable.
IMPRESSION: Negative radiographs of the right ankle.

## 2024-02-06 ENCOUNTER — Other Ambulatory Visit (HOSPITAL_COMMUNITY): Payer: Self-pay

## 2024-02-06 MED ORDER — BUPROPION HCL ER (XL) 150 MG PO TB24
150.0000 mg | ORAL_TABLET | Freq: Every morning | ORAL | 0 refills | Status: AC
  Filled 2024-02-06: qty 90, 90d supply, fill #0
